# Patient Record
Sex: Female | Born: 1985 | Race: White | Hispanic: No | Marital: Married
Health system: Southern US, Community
[De-identification: ages and names within clinical notes are randomized; demographics above are authoritative.]

---
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
## 2005-04-13 ENCOUNTER — Emergency Department (HOSPITAL_COMMUNITY): Admission: EM | Admit: 2005-04-13 | Discharge: 2005-04-14 | Payer: Self-pay | Admitting: Emergency Medicine

## 2006-09-02 IMAGING — CR DG FOOT COMPLETE 3+V*L*
3 series · 3 of 3 positions shown · non-contrast
Comparison: none

CLINICAL DATA: Left foot pain

LEFT FOOT - 3  VIEW:

[view not recorded (1 of 3)]
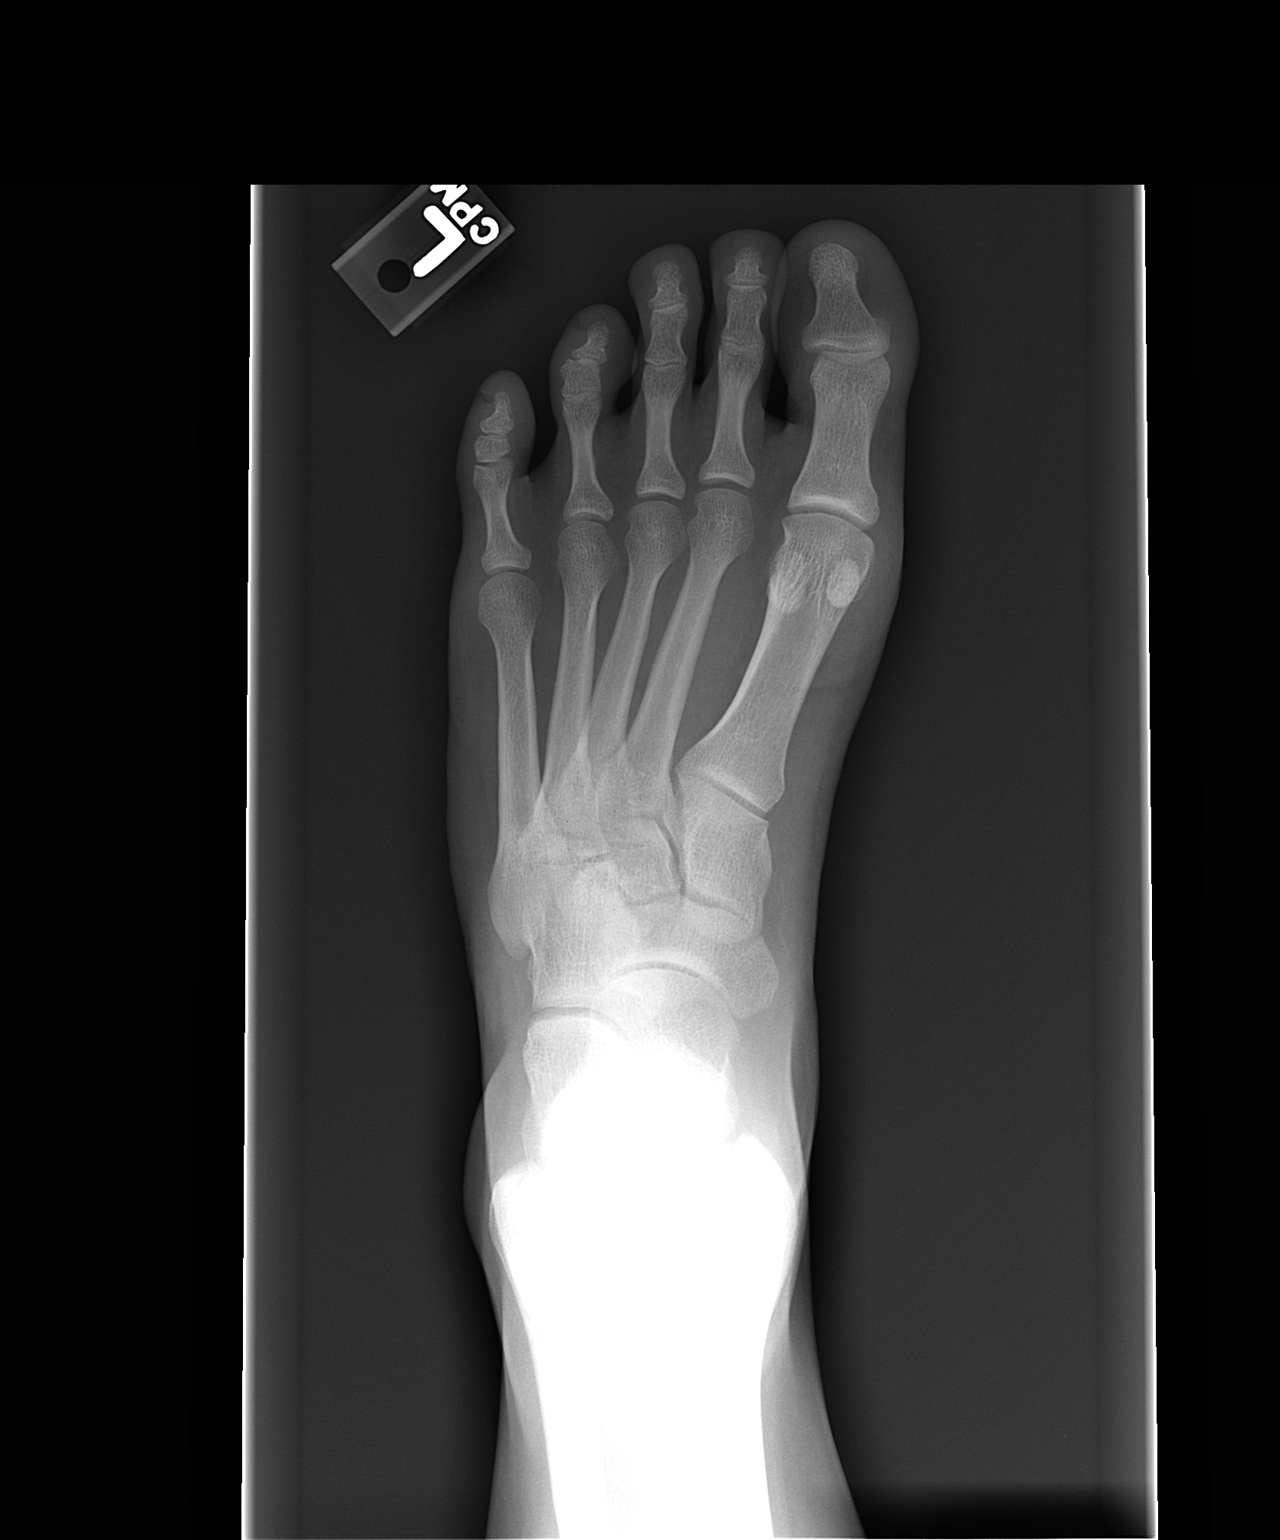

[view not recorded (2 of 3)]
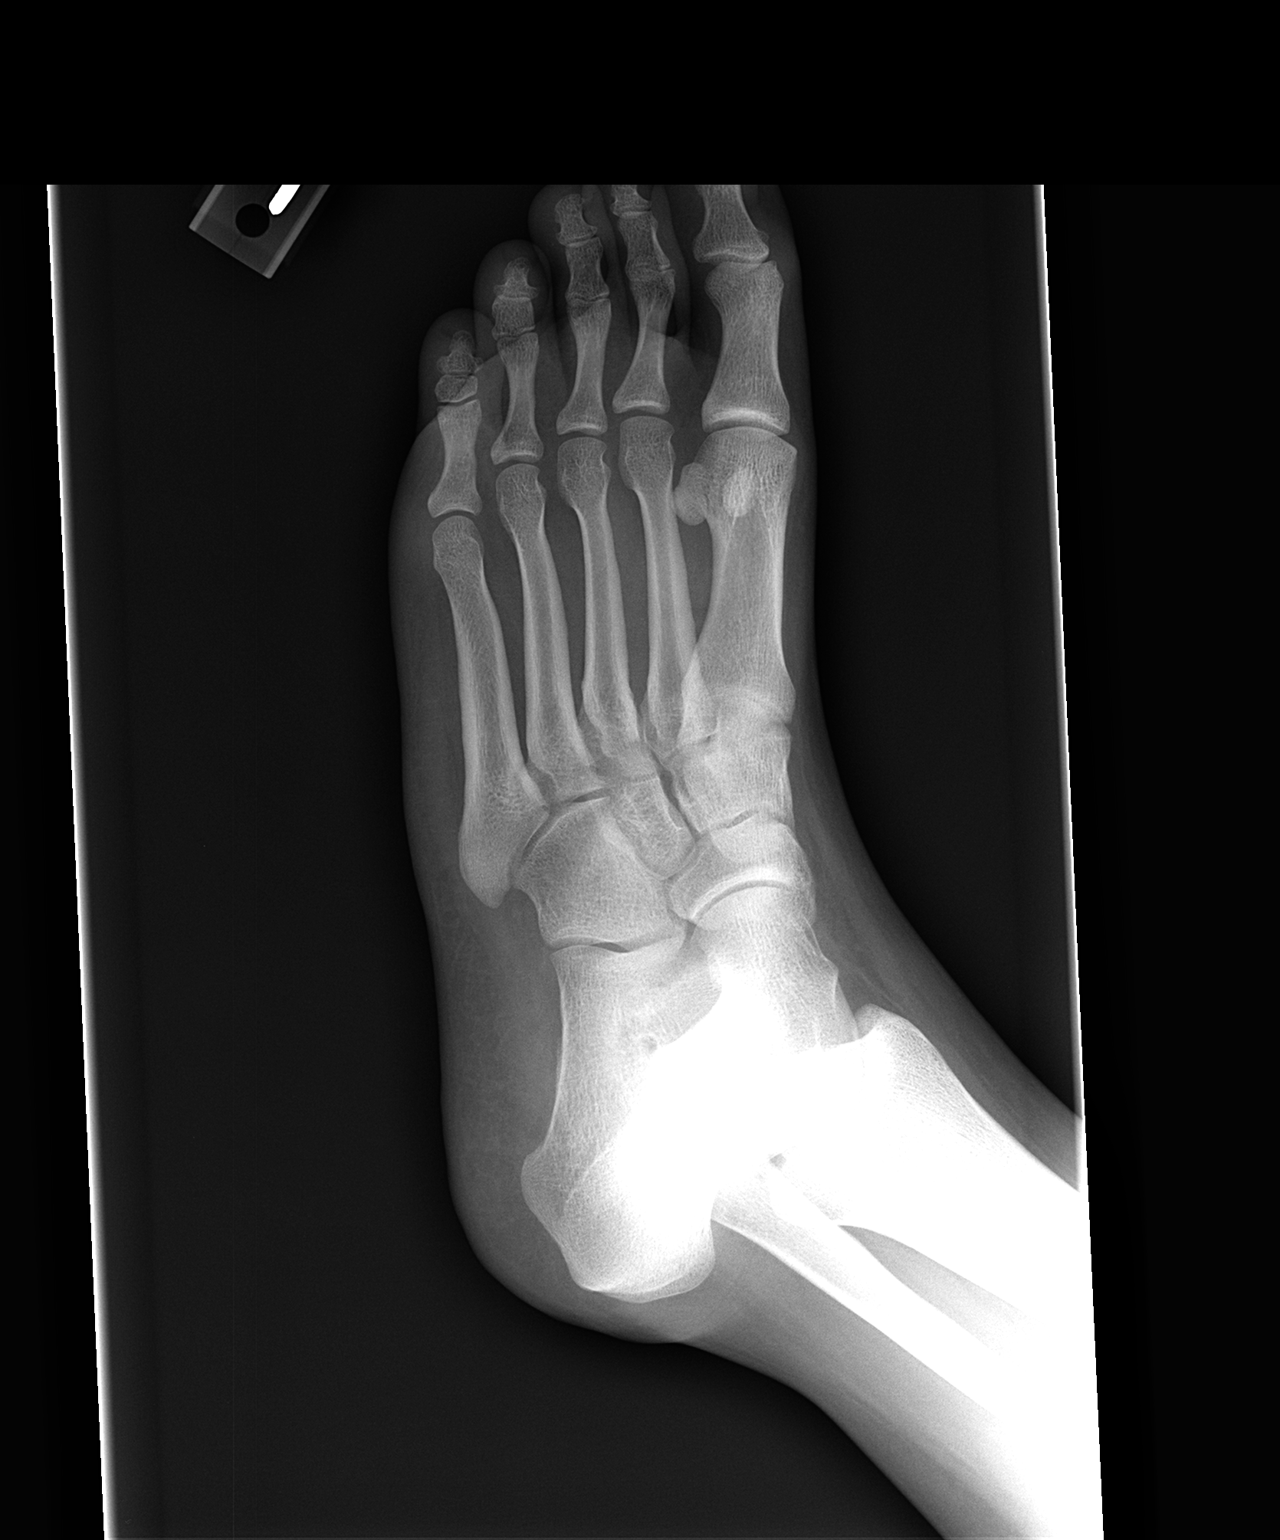

[view not recorded (3 of 3)]
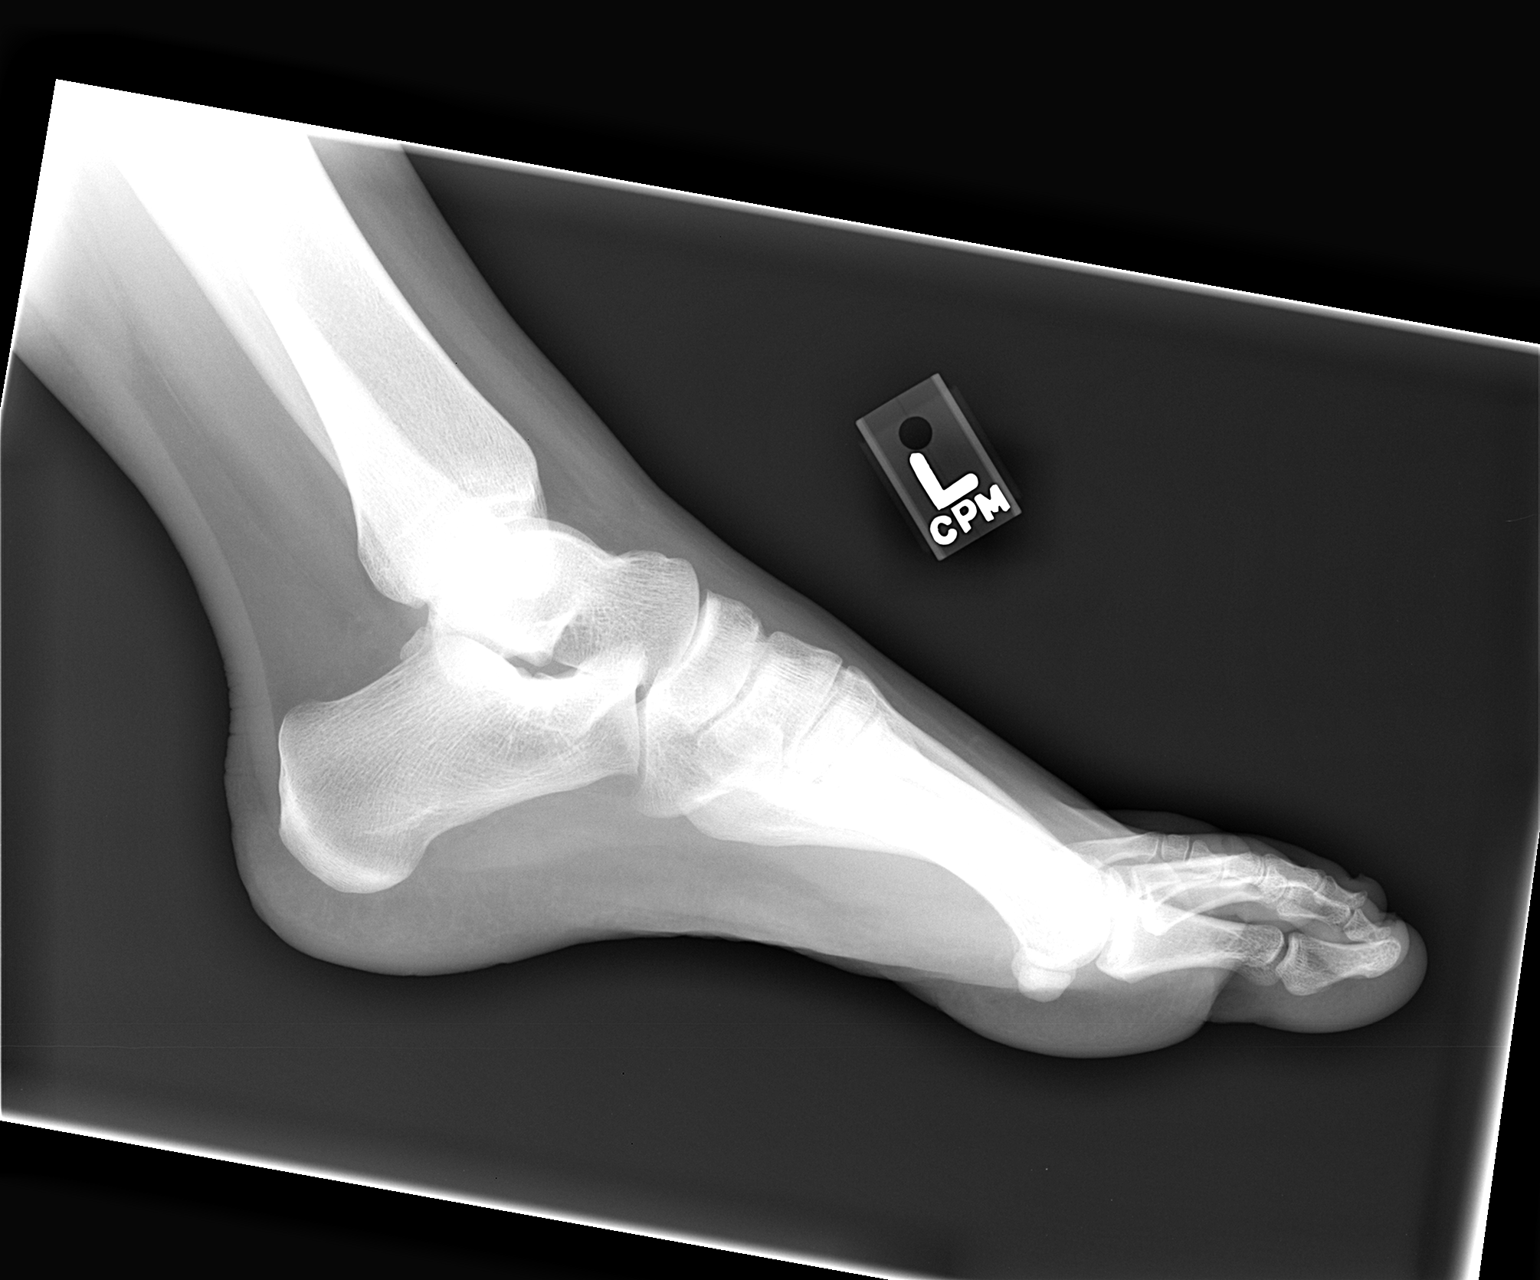

[3 of 3 positions shown; findings below may reference images not displayed]

FINDINGS: There is no evidence of fracture or dislocation.  There is no
evidence of arthropathy or other focal bone abnormality.  Soft tissues are
unremarkable.
IMPRESSION: Negative.

## 2009-01-06 ENCOUNTER — Encounter: Payer: Self-pay | Admitting: Internal Medicine

## 2009-07-22 ENCOUNTER — Encounter (INDEPENDENT_AMBULATORY_CARE_PROVIDER_SITE_OTHER): Payer: Self-pay | Admitting: *Deleted

## 2009-08-16 ENCOUNTER — Ambulatory Visit: Payer: Self-pay | Admitting: Internal Medicine

## 2009-08-16 DIAGNOSIS — K219 Gastro-esophageal reflux disease without esophagitis: Secondary | ICD-10-CM

## 2009-08-16 DIAGNOSIS — R079 Chest pain, unspecified: Secondary | ICD-10-CM

## 2009-08-24 ENCOUNTER — Ambulatory Visit (HOSPITAL_COMMUNITY): Admission: RE | Admit: 2009-08-24 | Discharge: 2009-08-24 | Payer: Self-pay | Admitting: Internal Medicine

## 2009-08-25 ENCOUNTER — Telehealth: Payer: Self-pay | Admitting: Internal Medicine

## 2009-09-07 ENCOUNTER — Encounter: Admission: RE | Admit: 2009-09-07 | Discharge: 2009-09-07 | Payer: Self-pay | Admitting: Family Medicine

## 2009-09-15 ENCOUNTER — Encounter: Admission: RE | Admit: 2009-09-15 | Discharge: 2009-11-09 | Payer: Self-pay | Admitting: Family Medicine

## 2010-08-07 ENCOUNTER — Encounter: Payer: Self-pay | Admitting: Internal Medicine

## 2010-08-16 NOTE — Progress Notes (Signed)
Summary: Ultrasound results/Back Pain  Phone Note Outgoing Call   Call placed by: Francee Piccolo CMA Duncan Dull),  August 25, 2009 10:21 AM Call placed to: Patient Summary of Call: I notified pt of [place] results, and advised that we have received her records from Dr. Ezzard Standing and they are on Dr. Marvell Fuller desk for review.  Pt states she is still having back pain.  If pain is not being caused by gallbladder pt wonders what else she can do for pain.  Pt states she has taken muscle relaxers and narcotic pain meds in the past and these have not helped her.  Please advise. Initial call taken by: Francee Piccolo CMA Duncan Dull),  August 25, 2009 10:25 AM  Follow-up for Phone Call        I was not thinking back pain is GI in nature so rec she PCP or if does not have one go to urgent [facility] we can fax our records She should stay on the Nexium and plan (schedule) to see me in 6 weeks to see if that helps suspected GERD Follow-up by: Iva Boop MD, Clementeen Graham,  August 26, 2009 1:36 PM  Additional Follow-up for Phone Call Additional follow up Details #1::        Return call from pt.  Advised of above.  She states she has been to Urgent Care before and was given muscle relaxer and ibuprofen.  Pt states she will see another physician, but is frustrated that we can not give her an answer. Additional Follow-up by: Francee Piccolo CMA Duncan Dull),  September 01, 2009 9:10 AM

## 2010-08-16 NOTE — Letter (Signed)
Summary: New Patient letter  [facility]  [address] [place], [place] [address]   Phone: [phone]  Fax: [phone]       07/22/2009 MRN: [id number]  [facility] [address] [place], [place]  [address]  Dear Tanya Mccormick,  Welcome to the Gastroenterology Division at [facility].    You are scheduled to see Dr. Leone Payor on 08-16-09 at 9:00a.m. on the 3rd floor at [facility], [address]  We ask that you try to arrive at our office 15 minutes prior to your appointment time to allow for check-in.  We would like you to complete the enclosed self-administered evaluation form prior to your visit and bring it with you on the day of your appointment.  We will review it with you.  Also, please bring a complete list of all your medications or, if you prefer, bring the medication bottles and we will list them.  Please bring your insurance card so that we may make a copy of it.  If your insurance requires a referral to see a specialist, please bring your referral form from your primary care physician.  Co-payments are due at the time of your visit and may be paid by cash, check or credit card.     Your office visit will consist of a consult with your physician (includes a physical exam), any laboratory testing he/she may order, scheduling of any necessary diagnostic testing (e.g. x-ray, ultrasound, CT-scan), and scheduling of a procedure (e.g. Endoscopy, Colonoscopy) if required.  Please allow enough time on your schedule to allow for any/all of these possibilities.    If you cannot keep your appointment, please call [phone] to cancel or reschedule prior to your appointment date.  This allows [place] the opportunity to schedule an appointment for another patient in need of care.  If you do not cancel or reschedule by 5 p.m. the business day prior to your appointment date, you will be charged a $50.00 late cancellation/no-show fee.    Thank you for  choosing [facility] for your medical needs.  We appreciate the opportunity to care for you.  Please visit [place] at our website  to learn more about our practice.                     Sincerely,                                                             The Gastroenterology Division

## 2010-08-16 NOTE — Assessment & Plan Note (Signed)
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 she has seen ENT for "throat problems" and was told she had GERD. Nexium rxed but could not afford and never took. she takes Prilosec with reilef but incomlete. Still ges acid reflux sxs and a sensation like pop rocks candy is stuck in throat. Nausea when she eats and upper back pain at times, which was helped by PPI.arseness to ENT. That was helped by the medicine.  Problems x 1 year overall, saw ENT 6 monhs ago, does not remember who. She had described globus and hoarseness. A red sore spot was seen, she says. Food actually helps glbus sensation mprove. There is no esophageal dysphagia.  Has insomnia and may be symptomatic at night but GERD sxs do not keep her up.  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  Not smoking (quit x 2 years), 1-2 cups "medium-size" , some caffeinated soda.   GI Review of Systems    Reports acid reflux, bloating, chest pain, and  heartburn.      Denies abdominal pain, belching, dysphagia with liquids, dysphagia with solids, loss of appetite, nausea, vomiting, vomiting blood, weight loss, and  weight gain.      Reports rectal pain.     Denies anal fissure, black tarry stools, change in bowel habit, constipation, diarrhea, diverticulosis, fecal incontinence, heme positive stool, hemorrhoids, irritable bowel syndrome, jaundice, light color stool, liver problems, and  rectal bleeding.    Current Medications (verified): 1)  Yasmin 28 3-0.03 Mg Tabs (Drospirenone-Ethinyl Estradiol) .... Once Daily 2)  Prilosec Otc 20 Mg Tbec (Omeprazole Magnesium) .... One Tablet By Mouth Once Daily  Allergies (verified): No Known Drug  Allergies  Past History:  Past Medical History: Anal Fissure Anxiety Disorder GERD ?LPR  Past Surgical History: Unremarkable  Family History: No FH of Colon Cancer: Family History of Breast Cancer:MGM Family History of Diabetes: On both sides of family   Social History: Occupation: IT consultant - developmental psychology Married No childern  Patient is a former smoker.  Alcohol Use - no Daily Caffeine Use: 2 daily  Illicit Drug Use - no Smoking Status:  quit Drug Use:  no  Review of Systems       The patient complains of back pain, heart murmur, and sleeping problems.         Chronic anxiety. All other ROS negative except as per HPI.   Vital Signs:  Patient profile:   25 year old female Height:      62 inches Weight:      186 pounds BMI:     34.14 BSA:     1.86 Pulse rate:   80 / minute Pulse rhythm:   regular BP sitting:   110 / 72  (left arm) Cuff size:   regular  Vitals Entered By: Ok Anis CMA (August 16, 2009 8:58 AM)  Nutrition Counseling: Patient's BMI is greater than 25 and therefore counseled on weight management options.  Discussed portion control mainly. She thought  Physical Exam  General:  obese.  NAD Eyes:  PERRLA, no icterus. Mouth:  No deformity or lesions, dentition normal. Neck:  Supple; no masses or thyromegaly. Lungs:  Clear throughout to auscultation. Heart:  Regular rate and rhythm; no  murmurs, rubs,  or bruits. Abdomen:  mildly obese, soft, NT, no HSM/mass BS+ navel is pierced Extremities:  No clubbing, cyanosis, edema or deformities noted. Neurologic:  Alert and  oriented x4;   Skin:  tattoo left inguinal region Cervical Nodes:  No significant cervical or supraclavicular adenopathy.  Psych:  Alert and cooperative. Normal mood and affect.   Impression & Recommendations:  Problem # 1:  GERD (ICD-530.81) Assessment New sounds like this is GERD, sxs x 1 year and no warning sgns like bleeding, weight loss or dysphagia  (has globus, not dysphagia) - so endoscopy not necessary at this time ? if anxiety is causing some somatic complaints also ? LPR get records start lansoprazole 30 mg daily anticipate f/u 2 mos or so and ? dose escalation await [place] before determining timing of follow-up will try to obtain ENT records, she is to call with ENT MD name  Problem # 2:  CHEST PAIN (ICD-786.50) Assessment: New this and back pain raise ? of gallstones, some sounds like possible and will check [place]  Orders: Ultrasound Abdomen (UAS)  Patient Instructions: 1)  Avoid foods high in acid content ( tomatoes, citrus juices, spicy foods) . Avoid eating within 3 to 4 hours of lying down or before exercising. Do not over eat; try smaller more frequent meals. Elevate head of bed four inches when sleeping. 2)  Your ultrasound is scheduled for 08/24/09 @ [facility]. 3)  Begin taking Lansoprazole instead of Prilosec.  4)  We will call you with follow up after we recieve your ultrasound report. 5)  Please call Judeth Cornfield with the name of your previous ENT physician. 6)  The medication list was reviewed and reconciled.  All changed / newly prescribed medications were explained.  A complete medication list was provided to the patient / caregiver. Prescriptions: LANSOPRAZOLE 30 MG CPDR (LANSOPRAZOLE) 1 by mouth 30-60 mins before breakfast  #30 x 3   Entered and Authorized by:   Iva Boop MD, [facility]   Signed by:   Iva Boop MD, FACG on 08/16/2009   Method used:   Electronically to        [id number] (retail)       [address]       [place], [place]  [address]       Ph: [phone] or [phone]       Fax: [phone]   RxID:   [id number]   Appended Document: ACID REFLUZ--CH. Pt ENT is Dr. Narda Bonds.  Release faxed.  Pt also states that Lansoprazole is too expensive.  Pt states per pharmacist Nexium will be cheaper using Purple Plus card.  Rx for Nexium sent to pharm.  Clinical Lists  Changes  Medications: Changed medication from LANSOPRAZOLE 30 MG CPDR (LANSOPRAZOLE) 1 by mouth 30-60 mins before breakfast to NEXIUM 40 MG CPDR (ESOMEPRAZOLE MAGNESIUM) Take 1 capsule by mouth once a day - Signed Rx of NEXIUM 40 MG CPDR (ESOMEPRAZOLE MAGNESIUM) Take 1 capsule by mouth once a day;  #30 x 3;  Signed;  Entered by: Francee Piccolo CMA (AAMA);  Authorized by: Iva Boop MD, FACG;  Method used: Electronically to [id number] [address] [place], [place]  [address], Ph: [phone] or [phone], Fax: [phone]   Prescriptions: NEXIUM 40 MG CPDR (ESOMEPRAZOLE MAGNESIUM) Take 1 capsule by mouth once a day  #30 x 3   Entered by:   Francee Piccolo CMA (AAMA)   Authorized by:   Iva Boop  MD, Clementeen Graham   Signed by:   Francee Piccolo CMA (AAMA) on 08/20/2009   Method used:   Electronically to        [id number] (retail)       [address]       [place], [place]  [address]       Ph: [phone] or [phone]       Fax: [phone]   RxID:   [id number]

## 2010-08-16 NOTE — Letter (Signed)
Summary: Narda Bonds MD  Narda Bonds MD   Imported By: Sherian Rein 09/01/2009 07:54:58  _____________________________________________________________________  External Attachment:    Type:   Image     Comment:   External Document

## 2010-11-30 ENCOUNTER — Ambulatory Visit: Payer: No Typology Code available for payment source | Attending: Family Medicine | Admitting: Physical Therapy

## 2010-11-30 DIAGNOSIS — IMO0001 Reserved for inherently not codable concepts without codable children: Secondary | ICD-10-CM | POA: Insufficient documentation

## 2010-11-30 DIAGNOSIS — M546 Pain in thoracic spine: Secondary | ICD-10-CM | POA: Insufficient documentation

## 2010-11-30 DIAGNOSIS — M62838 Other muscle spasm: Secondary | ICD-10-CM | POA: Insufficient documentation

## 2010-11-30 DIAGNOSIS — R293 Abnormal posture: Secondary | ICD-10-CM | POA: Insufficient documentation

## 2010-12-01 ENCOUNTER — Ambulatory Visit: Payer: No Typology Code available for payment source | Admitting: Physical Therapy

## 2010-12-06 ENCOUNTER — Ambulatory Visit: Payer: No Typology Code available for payment source | Admitting: Physical Therapy

## 2010-12-07 ENCOUNTER — Ambulatory Visit: Payer: No Typology Code available for payment source | Admitting: Physical Therapy

## 2010-12-09 ENCOUNTER — Ambulatory Visit: Payer: No Typology Code available for payment source | Admitting: Physical Therapy

## 2010-12-13 ENCOUNTER — Ambulatory Visit: Payer: No Typology Code available for payment source | Admitting: Physical Therapy

## 2010-12-15 ENCOUNTER — Ambulatory Visit: Payer: No Typology Code available for payment source | Admitting: Physical Therapy

## 2010-12-19 ENCOUNTER — Ambulatory Visit: Payer: No Typology Code available for payment source | Attending: Family Medicine | Admitting: Physical Therapy

## 2010-12-19 DIAGNOSIS — M546 Pain in thoracic spine: Secondary | ICD-10-CM | POA: Insufficient documentation

## 2010-12-19 DIAGNOSIS — IMO0001 Reserved for inherently not codable concepts without codable children: Secondary | ICD-10-CM | POA: Insufficient documentation

## 2010-12-19 DIAGNOSIS — M62838 Other muscle spasm: Secondary | ICD-10-CM | POA: Insufficient documentation

## 2010-12-19 DIAGNOSIS — R293 Abnormal posture: Secondary | ICD-10-CM | POA: Insufficient documentation

## 2010-12-21 ENCOUNTER — Ambulatory Visit: Payer: No Typology Code available for payment source | Admitting: Physical Therapy

## 2010-12-23 ENCOUNTER — Ambulatory Visit: Payer: No Typology Code available for payment source | Admitting: Physical Therapy

## 2010-12-26 ENCOUNTER — Ambulatory Visit: Payer: No Typology Code available for payment source | Admitting: Physical Therapy

## 2010-12-28 ENCOUNTER — Ambulatory Visit: Payer: No Typology Code available for payment source | Admitting: Physical Therapy

## 2010-12-29 ENCOUNTER — Encounter: Payer: Self-pay | Admitting: Physical Therapy

## 2010-12-30 ENCOUNTER — Ambulatory Visit: Payer: No Typology Code available for payment source | Admitting: Physical Therapy

## 2011-01-02 ENCOUNTER — Ambulatory Visit: Payer: No Typology Code available for payment source | Admitting: Physical Therapy

## 2011-01-04 ENCOUNTER — Ambulatory Visit: Payer: No Typology Code available for payment source | Admitting: Physical Therapy

## 2011-01-06 ENCOUNTER — Ambulatory Visit: Payer: No Typology Code available for payment source | Admitting: Physical Therapy

## 2011-01-09 ENCOUNTER — Ambulatory Visit: Payer: No Typology Code available for payment source | Admitting: Physical Therapy

## 2011-01-11 ENCOUNTER — Ambulatory Visit: Payer: No Typology Code available for payment source | Admitting: Physical Therapy

## 2011-01-13 ENCOUNTER — Ambulatory Visit: Payer: No Typology Code available for payment source | Admitting: Physical Therapy

## 2011-01-24 ENCOUNTER — Encounter: Payer: No Typology Code available for payment source | Admitting: Physical Therapy

## 2011-01-25 ENCOUNTER — Encounter: Payer: No Typology Code available for payment source | Admitting: Physical Therapy

## 2011-01-27 ENCOUNTER — Ambulatory Visit: Payer: No Typology Code available for payment source | Attending: Family Medicine | Admitting: Physical Therapy

## 2011-01-27 DIAGNOSIS — M62838 Other muscle spasm: Secondary | ICD-10-CM | POA: Insufficient documentation

## 2011-01-27 DIAGNOSIS — M546 Pain in thoracic spine: Secondary | ICD-10-CM | POA: Insufficient documentation

## 2011-01-27 DIAGNOSIS — R293 Abnormal posture: Secondary | ICD-10-CM | POA: Insufficient documentation

## 2011-01-27 DIAGNOSIS — IMO0001 Reserved for inherently not codable concepts without codable children: Secondary | ICD-10-CM | POA: Insufficient documentation

## 2011-02-07 ENCOUNTER — Ambulatory Visit: Payer: No Typology Code available for payment source | Admitting: Physical Therapy

## 2011-02-09 ENCOUNTER — Ambulatory Visit: Payer: No Typology Code available for payment source | Admitting: Physical Therapy

## 2011-02-14 ENCOUNTER — Encounter: Payer: No Typology Code available for payment source | Admitting: Physical Therapy

## 2011-02-17 ENCOUNTER — Ambulatory Visit: Payer: No Typology Code available for payment source | Attending: Family Medicine | Admitting: Physical Therapy

## 2011-02-17 DIAGNOSIS — M62838 Other muscle spasm: Secondary | ICD-10-CM | POA: Insufficient documentation

## 2011-02-17 DIAGNOSIS — IMO0001 Reserved for inherently not codable concepts without codable children: Secondary | ICD-10-CM | POA: Insufficient documentation

## 2011-02-17 DIAGNOSIS — R293 Abnormal posture: Secondary | ICD-10-CM | POA: Insufficient documentation

## 2011-02-17 DIAGNOSIS — M546 Pain in thoracic spine: Secondary | ICD-10-CM | POA: Insufficient documentation

## 2014-09-02 ENCOUNTER — Telehealth: Payer: Self-pay | Admitting: Cardiovascular Disease

## 2014-09-02 NOTE — Telephone Encounter (Signed)
[facility] with Dr Allyson SabalBerry.  [facility] Dr Hazle CocaBerry's schedule on 09/14/14.  lp

## 2014-09-14 ENCOUNTER — Ambulatory Visit: Payer: Self-pay | Admitting: Cardiovascular Disease
